# Patient Record
Sex: Male | Born: 1970 | Race: Black or African American | Hispanic: No | Smoking: Current some day smoker
Health system: Southern US, Community
[De-identification: ages and names within clinical notes are randomized; demographics above are authoritative.]

---

## 2003-06-03 ENCOUNTER — Encounter (INDEPENDENT_AMBULATORY_CARE_PROVIDER_SITE_OTHER): Payer: Self-pay | Admitting: Specialist

## 2003-06-03 ENCOUNTER — Ambulatory Visit (HOSPITAL_COMMUNITY): Admission: RE | Admit: 2003-06-03 | Discharge: 2003-06-03 | Payer: Self-pay | Admitting: Urology

## 2003-06-03 ENCOUNTER — Ambulatory Visit (HOSPITAL_BASED_OUTPATIENT_CLINIC_OR_DEPARTMENT_OTHER): Admission: RE | Admit: 2003-06-03 | Discharge: 2003-06-03 | Payer: Self-pay | Admitting: Urology

## 2008-10-12 ENCOUNTER — Encounter: Admission: RE | Admit: 2008-10-12 | Discharge: 2008-10-12 | Payer: Self-pay | Admitting: Family Medicine

## 2009-11-17 IMAGING — CR DG SHOULDER 2+V*R*
3 series · 3 of 3 positions shown · non-contrast
Comparison: None

CLINICAL DATA: Chronic pain, no trauma

RIGHT SHOULDER - 2+ VIEW

[view not recorded (1 of 3)]
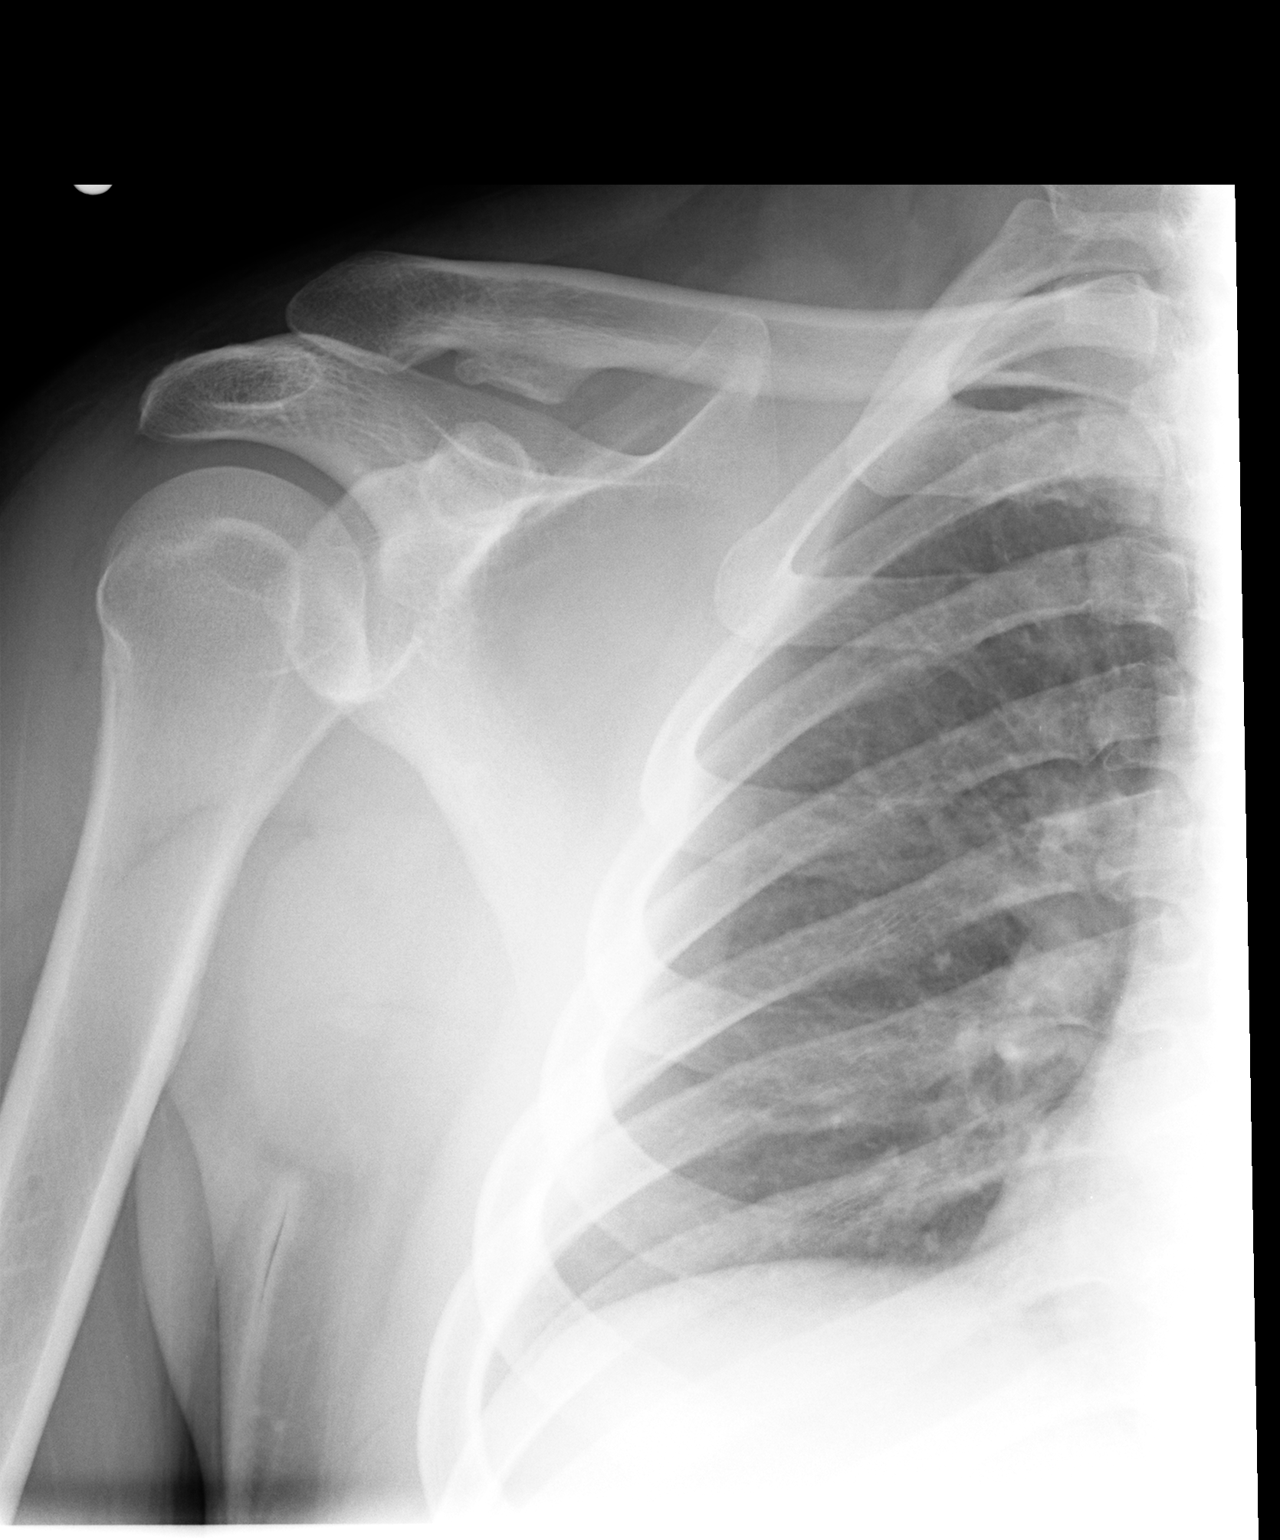

[view not recorded (2 of 3)]
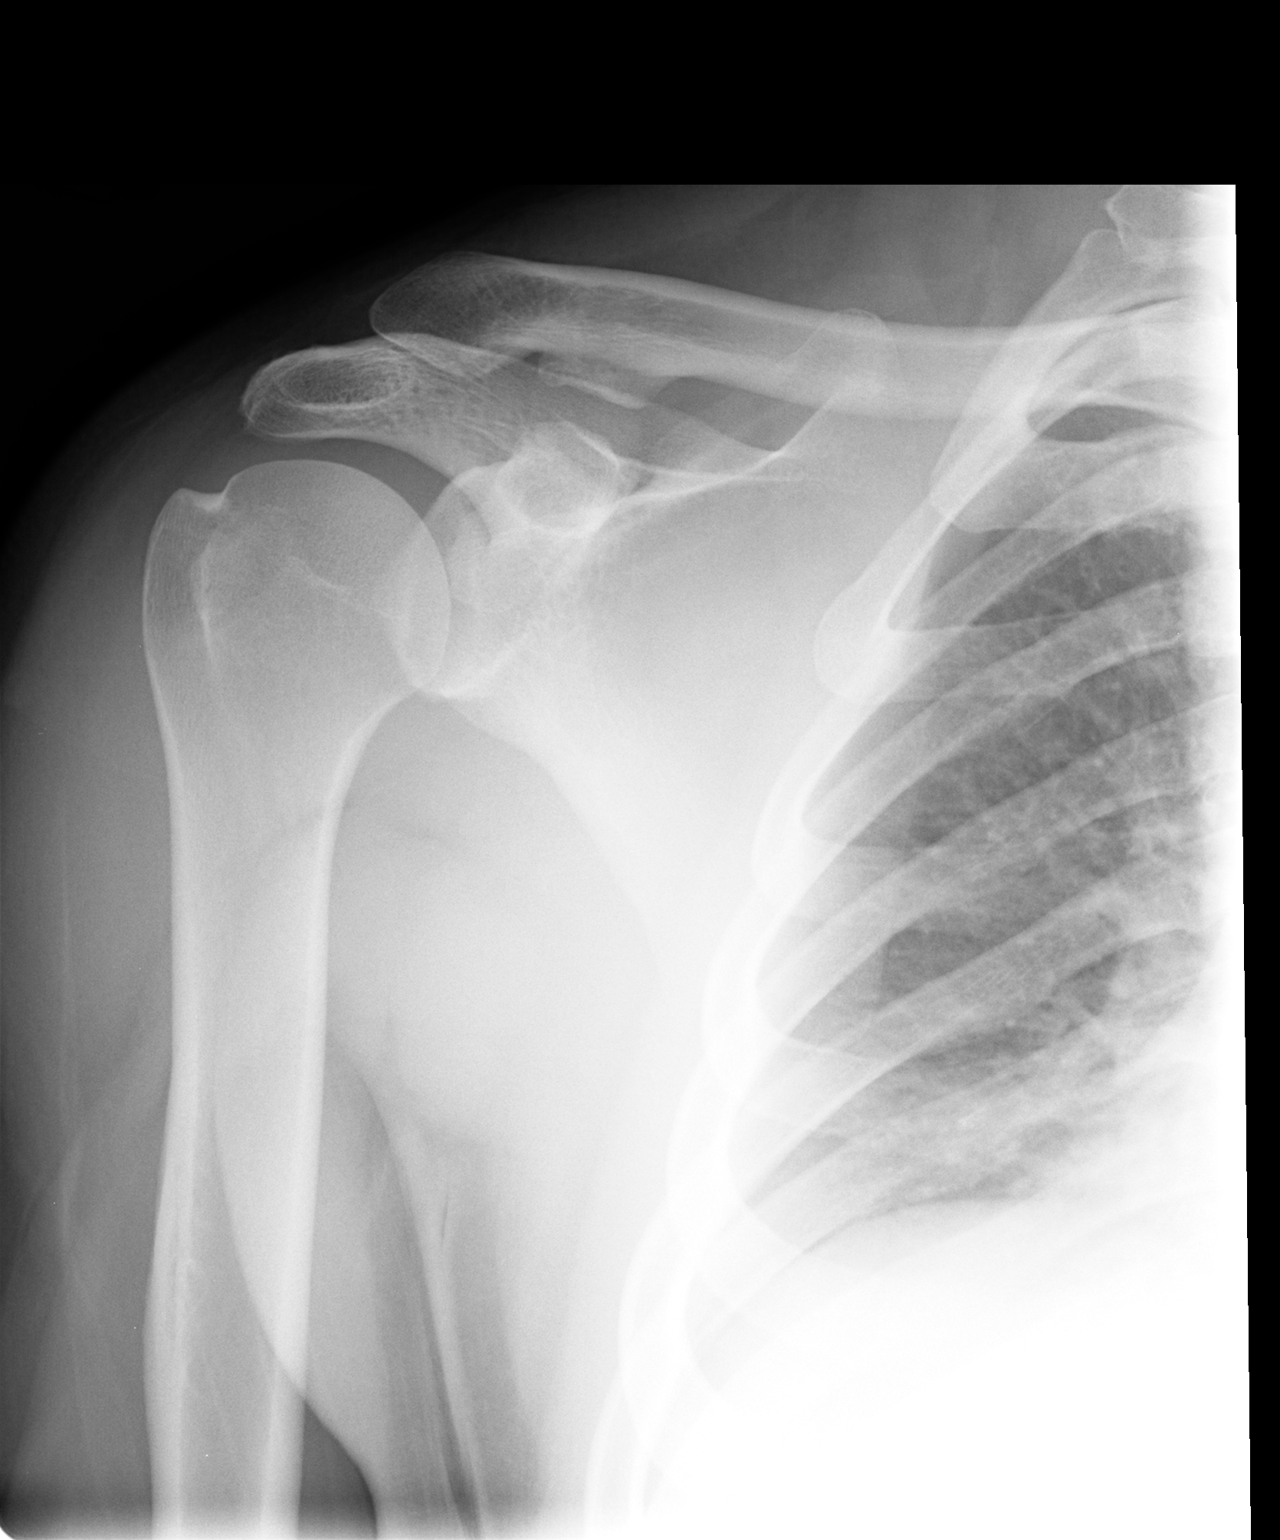

[view not recorded (3 of 3)]
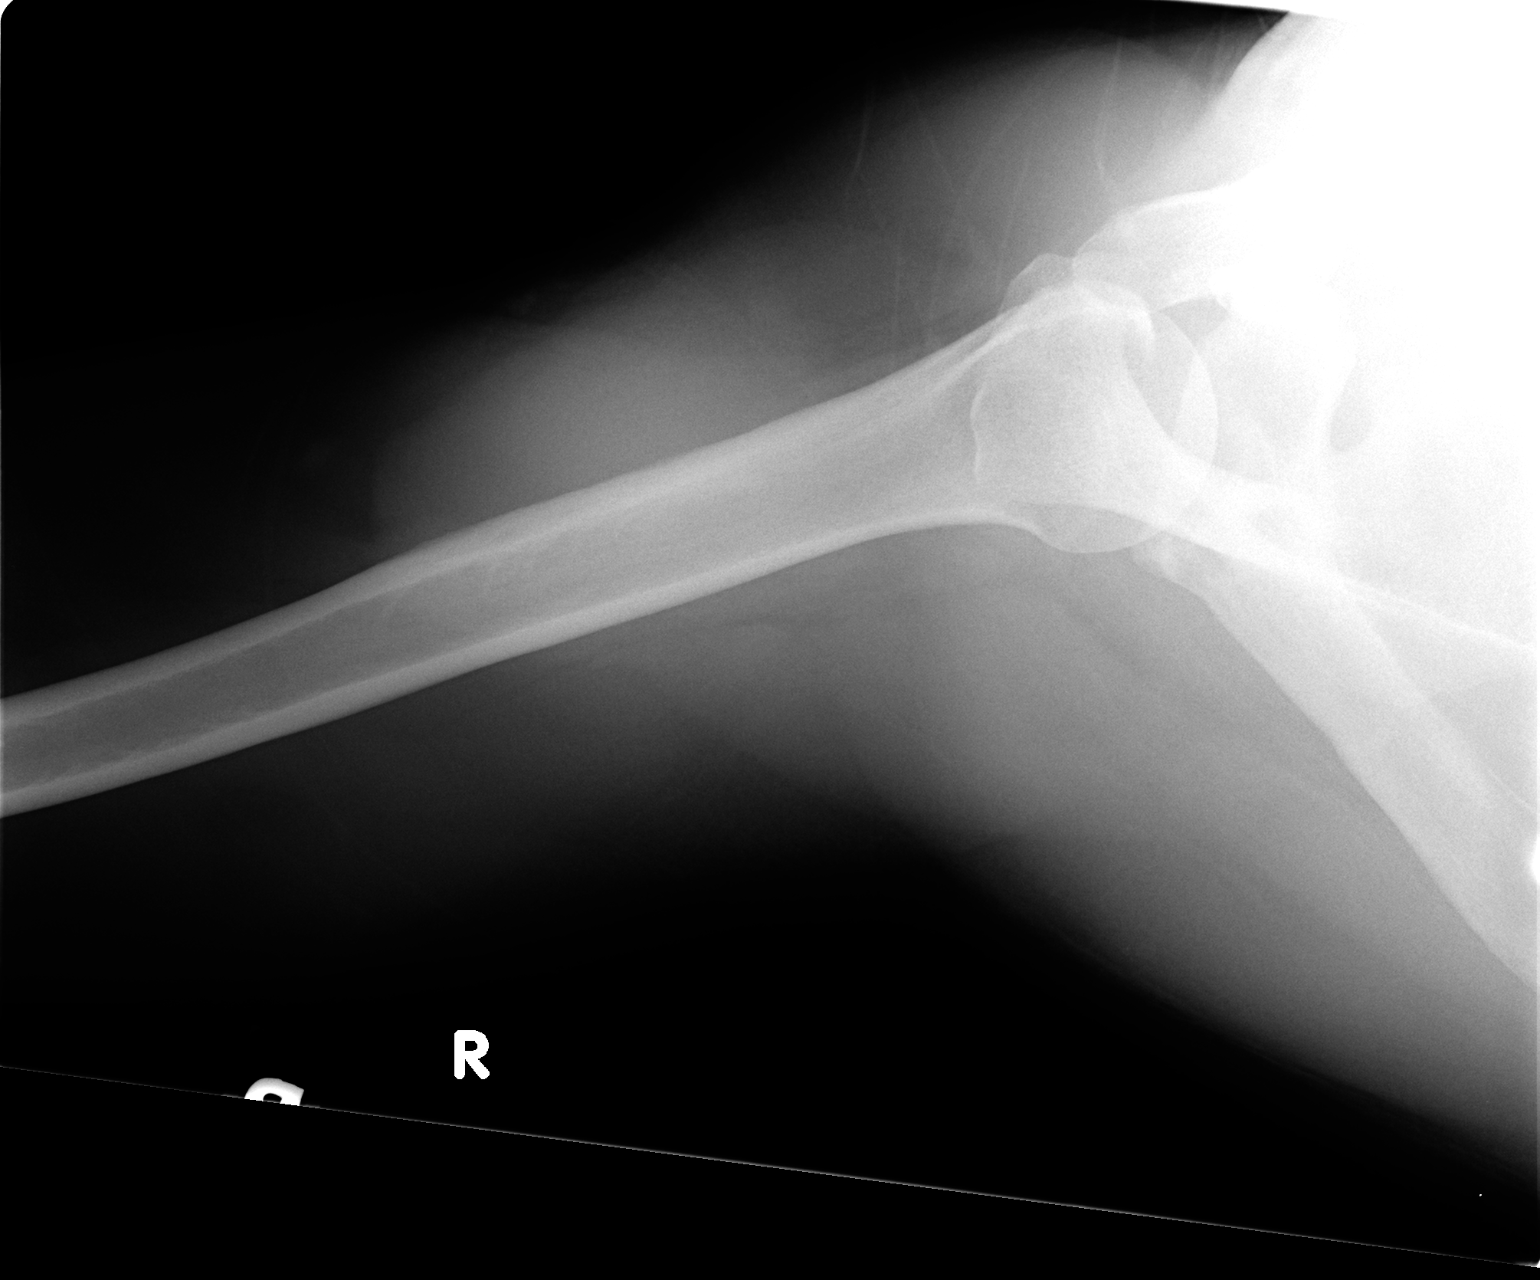

[3 of 3 positions shown; findings below may reference images not displayed]

FINDINGS: The right glenohumeral joint space appears normal.  The
right AC joint is normally aligned.  No acute abnormality is seen.
IMPRESSION: Negative right shoulder.

## 2010-10-13 NOTE — Op Note (Signed)
NAME:  Shane Mitchell, Shane Mitchell                         ACCOUNT NO.:  [id number]   MEDICAL RECORD NO.:  [id number]                   PATIENT TYPE:  AMB   LOCATION:  [facility]                                 FACILITY:  [facility]   PHYSICIAN:  Sigmund I. Patsi Sears, M.D.         DATE OF BIRTH:  12/23/1970   DATE OF PROCEDURE:  06/03/2003  DATE OF DISCHARGE:                                 OPERATIVE REPORT   PREOPERATIVE DIAGNOSIS:  Left varicocele.   POSTOPERATIVE DIAGNOSIS:  Left varicocele.   OPERATION:  Left internal spermatic vein ligation.   SURGEON:  Sigmund I. Patsi Sears, M.D.   ANESTHESIA:  LMA   PREPARATION:  After appropriate preanesthesia, the patient was brought to  the operating room, placed on the operating table in the dorsal supine  position where general LMA anesthesia was introduced. He remained in the  supine position where the pubis was washed was Betadine soap, shaven,  prepped with Betadine solution, and draped in the usual fashion.   DESCRIPTION OF PROCEDURE:  A 5 cm left inguinal incision was made,  subcutaneous tissue was dissected with the electrosurgical unit.  A large  superficial vein was identified and this was ligated with 3-0 silk suture.  Following this, the external oblique fascia was entered, distal to the  external inguinal ring, and the spermatic cord was identified by placing a  right angled clamp underneath the cord, in identifying the pubic tubercle.  A Penrose drain was then placed underneath the spermatic cord.   The spermatic cord was then dissected, with care taken to avoid injury to  the vas, or to the artery to the testicle. The artery was identified by  palpation and direct vision.   The remaining spermatic cord was dissected in multiple layers with  dissection of the veins and ligation of the veins with 3-0 silk sutures. The  venous structures were sent to the laboratory for evaluation.  0.25 plain  Marcaine plane was injected into the spermatic  cord to affect a spermatic  cord block, and also into the wound edges. Following this, the wound was  then closed in multiple layers with 3-0 Vicryl suture. The skin edges were  closed with skin stapler. The patient received IV Toradol and was awakened  and taken to the recovery room in good condition.                                               Sigmund I. Patsi Sears, M.D.    SIT/MEDQ  D:  06/03/2003  T:  06/03/2003  Job:  [id number]

## 2020-05-18 DIAGNOSIS — Z20822 Contact with and (suspected) exposure to covid-19: Secondary | ICD-10-CM | POA: Diagnosis not present

## 2020-06-22 DIAGNOSIS — Z20822 Contact with and (suspected) exposure to covid-19: Secondary | ICD-10-CM | POA: Diagnosis not present

## 2021-01-21 DIAGNOSIS — Z20822 Contact with and (suspected) exposure to covid-19: Secondary | ICD-10-CM | POA: Diagnosis not present

## 2021-03-13 ENCOUNTER — Ambulatory Visit: Payer: BC Managed Care – PPO | Admitting: Podiatry

## 2021-03-13 ENCOUNTER — Other Ambulatory Visit: Payer: Self-pay

## 2021-03-13 DIAGNOSIS — L600 Ingrowing nail: Secondary | ICD-10-CM | POA: Diagnosis not present

## 2021-03-13 MED ORDER — GENTAMICIN SULFATE 0.1 % EX CREA: 1.0000 "application " | TOPICAL_CREAM | Freq: Two times a day (BID) | CUTANEOUS | 1 refills | Status: AC

## 2021-03-13 MED ORDER — TERBINAFINE HCL 250 MG PO TABS: 250.0000 mg | ORAL_TABLET | Freq: Every day | ORAL | 0 refills | Status: AC

## 2021-03-13 NOTE — Progress Notes (Addendum)
   Subjective: Patient presents today for evaluation of pain to the lateral border right great toe. Patient is concerned for possible ingrown nail.  It is very sensitive to touch.  Patient also states that he has had some thick discoloration to his toenails for the last several years.  He would like to discuss different treatment options.  Patient presents today for further treatment and evaluation.  No past medical history on file.  Objective:  General: Well developed, nourished, in no acute distress, alert and oriented x3   Dermatology: Skin is warm, dry and supple bilateral.  Lateral border right great toe appears to be erythematous with evidence of an ingrowing nail. Pain on palpation noted to the border of the nail fold. The remaining nails appear unremarkable at this time. There are no open sores, lesions.  Hyperkeratotic dystrophic elongated nails also noted to the bilateral great toes with associated tenderness  Vascular: Dorsalis Pedis artery and Posterior Tibial artery pedal pulses palpable. No lower extremity edema noted.   Neruologic: Grossly intact via light touch bilateral.  Musculoskeletal: Muscular strength within normal limits in all groups bilateral. Normal range of motion noted to all pedal and ankle joints.   Assesement: #1 Paronychia with ingrowing nail lateral border right great toe #2 onychomycosis of toenails both  Plan of Care:  1. Patient evaluated.  2. Discussed treatment alternatives and plan of care. Explained nail avulsion procedure and post procedure course to patient. 3. Patient opted for permanent partial nail avulsion of the ingrown portion of the nail.  4. Prior to procedure, local anesthesia infiltration utilized using 3 ml of a 50:50 mixture of 2% plain lidocaine and 0.5% plain marcaine in a normal hallux block fashion and a betadine prep performed.  5. Partial permanent nail avulsion with chemical matrixectomy performed using 3x30sec applications of  phenol followed by alcohol flush.  6. Light dressing applied.  Post care instructions provided 7.  Prescription for gentamicin 2% cream  8.  Today we discussed different treatment options for onychomycosis of the toenails including oral, topical, and laser antifungal treatment modalities.  Efficacies and side effects explained.  The patient opts for oral antifungal treatment.  He is otherwise healthy and denies a history of liver pathology or symptoms.   9.  Prescription for Lamisil 2 and 50 mg #90 daily  10.  Return to clinic 2 weeks.  *Financing department at [facility]  Felecia Shelling, DPM [facility]  Dr. Felecia Shelling, DPM    [address] [place], [place] [address]                Office [phone]  Fax [phone]

## 2021-03-13 NOTE — Addendum Note (Signed)
Addended by: Felecia Shelling on: 03/13/2021 12:05 PM   Modules accepted: Orders

## 2021-03-27 ENCOUNTER — Ambulatory Visit: Payer: BC Managed Care – PPO | Admitting: Podiatry

## 2021-09-29 ENCOUNTER — Telehealth: Payer: BC Managed Care – PPO | Admitting: Physician Assistant

## 2021-09-29 DIAGNOSIS — L301 Dyshidrosis [pompholyx]: Secondary | ICD-10-CM

## 2021-09-29 MED ORDER — TRIAMCINOLONE ACETONIDE 0.1 % EX CREA: 1.0000 "application " | TOPICAL_CREAM | Freq: Two times a day (BID) | CUTANEOUS | 0 refills | Status: AC

## 2021-09-29 NOTE — Progress Notes (Signed)
E-Visit for Eczema ? ?We are sorry that you are not feeling well. Here is how we plan to help! ?Based on what you shared with me it looks like you have eczema (atopic dermatitis).  Although the cause of eczema is not completely understood, genetics appear to play a strong role, and people with a family history of eczema are at increased risk of developing the condition. In most people with eczema, there is a genetic abnormality in the outermost layer of the skin, called the epidermis  ? ?Most people with eczema develop their first symptoms as children, before the age of five. Intense itching of the skin, patches of redness, small bumps, and skin flaking are common. Scratching can further inflame the skin and worsen the itching. The itchiness may be more noticeable at nighttime. ? ?Eczema commonly affects the back of the neck, the elbow creases, and the backs of the knees. Other affected areas may include the face, wrists, and forearms. The skin may become thickened and darkened, or even scarred, from repeated scratching. ?Eliminating factors that aggravate your eczema symptoms can help to control the symptoms. Possible triggers may include: ?? Cold or dry environments ?? Sweating ?? Emotional stress or anxiety ?? Rapid temperature changes ?? Exposure to certain chemicals or cleaning solutions, including soaps and detergents, perfumes and cosmetics, wool or synthetic fibers, dust, sand, and cigarette smoke ?Keeping your skin hydrated ?Emollients -- Emollients are creams and ointments that moisturize the skin and prevent it from drying out. The best emollients for people with eczema are thick creams (such as Eucerin, Cetaphil, and Nutraderm) or ointments (such as petroleum jelly, Aquaphor, and Vaseline), which contain little to no water. Emollients are most effective when applied immediately after bathing. Emollients can be applied twice a day or more often if needed. Lotions contain more water than creams and  ointments and are less effective for moisturizing the skin. ?Bathing -- It is not clear if showers or baths are better for keeping the skin hydrated. Lukewarm baths or showers can hydrate and cool the skin, temporarily relieving itching from eczema. An unscented, mild soap or non-soap cleanser (such as Cetaphil) should be used sparingly. Apply an emollient immediately after bathing or showering to prevent your skin from drying out as a result of water evaporation. Emollient bath additives (products you add to the bath water) have not been found to help relieve symptoms. ?Hot or long baths (more than 10 to 15 minutes) and showers should be avoided since they can dry out the skin. ? ?Based on what you shared with me you may have eczema.  ? ?I have prescribed: and Triamcinalone ointment (or cream). Apply to the effected areas twice per day. ? ?I recommend dilute bleach baths for people with eczema. These baths help to decrease the number of bacteria on the skin that can cause infections or worsen symptoms. To prepare a bleach bath, one-fourth to one-half cup of bleach is placed in a full bathtub (about 40 gallons) of water. Bleach baths are usually taken for 5 to 10 minutes twice per week and should be followed by application of an emollient (listed above). ?I recommend you take Benadryl 25mg - 50mg every 4 hours to control the symptoms (including itching) but if they last over 24 hours it is best that you see an office based provider for follow up. ? ?HOME CARE: ?Take lukewarm showers or baths ?Apply creams and ointments to prevent the skin from drying (Eucerin, Cetaphil, Nutraderm, petroleum jelly, Aquaphor or   Vaseline) - these products contain less water than other lotions and are more effective for moisturizing the skin ?Limit exposure to cold or dry environments, sweating, emotional stress and anxiety, rapid temperature changes and exposure to chemicals and cleaning products, soaps and detergents, perfumes,  cosmetics, wool and synthetic fibers, dust, sand and cigarette- factors which can aggravate eczema symptoms.  ?Use a hydrocortisone cream once or twice a day ?Take an antihistamine like Benadryl for widespread rashes that itch.  The adult dosage of Benadryl is 25-50 mg by mouth 4 times daily. ?Caution: This type of medication may cause sleepiness.  Do not drink alcohol, drive, or operate dangerous machinery while taking antihistamines.  Do not take these medications if you have prostate enlargement.  Read the package instructions thoroughly on all medications that you take.  ?GET HELP RIGHT AWAY IF: ?Symptoms that don't go away after treatment. ?Severe itching that persists. ?You develop a fever. ?Your skin begins to drain. ?You have a sore throat. ?You become short of breath. ? ?MAKE SURE YOU  ? ?Understand these instructions. ?Will watch your condition. ?Will get help right away if you are not doing well or get worse. ? ? ?Thank you for choosing an e-visit. ? ?Your e-visit answers were reviewed by a board certified advanced clinical practitioner to complete your personal care plan. Depending upon the condition, your plan could have included both over the counter or prescription medications. ? ?Please review your pharmacy choice. Make sure the pharmacy is open so you can pick up prescription now. If there is a problem, you may contact your provider through MyChart messaging and have the prescription routed to another pharmacy.  Your safety is important to us. If you have drug allergies check your prescription carefully.  ? ?For the next 24 hours you can use MyChart to ask questions about today's visit, request a non-urgent call back, or ask for a work or school excuse. ?You will get an email in the next two days asking about your experience. I hope that your e-visit has been valuable and will speed your recovery. ? ?I provided 5 minutes of non face-to-face time during this encounter for chart review and  documentation.  ? ?

## 2021-10-04 DIAGNOSIS — M20011 Mallet finger of right finger(s): Secondary | ICD-10-CM | POA: Diagnosis not present

## 2022-01-23 ENCOUNTER — Ambulatory Visit: Payer: BC Managed Care – PPO | Admitting: Nurse Practitioner

## 2022-02-14 ENCOUNTER — Telehealth: Payer: BC Managed Care – PPO | Admitting: Physician Assistant

## 2022-02-14 DIAGNOSIS — J069 Acute upper respiratory infection, unspecified: Secondary | ICD-10-CM

## 2022-02-14 MED ORDER — BENZONATATE 100 MG PO CAPS: 100.0000 mg | ORAL_CAPSULE | Freq: Three times a day (TID) | ORAL | 0 refills | Status: AC | PRN

## 2022-02-14 MED ORDER — IPRATROPIUM BROMIDE 0.03 % NA SOLN: 2.0000 | Freq: Two times a day (BID) | NASAL | 0 refills | Status: AC

## 2022-02-14 MED ORDER — PSEUDOEPH-BROMPHEN-DM 30-2-10 MG/5ML PO SYRP: 5.0000 mL | ORAL_SOLUTION | Freq: Four times a day (QID) | ORAL | 0 refills | Status: AC | PRN

## 2022-02-14 NOTE — Patient Instructions (Signed)
Shane Mitchell, thank you for joining Mar Daring, PA-C for today's virtual visit.  While this provider is not your primary care provider (PCP), if your PCP is located in our provider database this encounter information will be shared with them immediately following your visit.  Consent: (Patient) Shane Mitchell provided verbal consent for this virtual visit at the beginning of the encounter.  Current Medications:  Current Outpatient Medications:    benzonatate (TESSALON) 100 MG capsule, Take 1 capsule (100 mg total) by mouth 3 (three) times daily as needed., Disp: 30 capsule, Rfl: 0   brompheniramine-pseudoephedrine-DM 30-2-10 MG/5ML syrup, Take 5 mLs by mouth 4 (four) times daily as needed., Disp: 120 mL, Rfl: 0   ipratropium (ATROVENT) 0.03 % nasal spray, Place 2 sprays into both nostrils every 12 (twelve) hours., Disp: 30 mL, Rfl: 0   gentamicin cream (GARAMYCIN) 0.1 %, Apply 1 application topically 2 (two) times daily., Disp: 30 g, Rfl: 1   terbinafine (LAMISIL) 250 MG tablet, Take 1 tablet (250 mg total) by mouth daily., Disp: 90 tablet, Rfl: 0   triamcinolone cream (KENALOG) 0.1 %, Apply 1 application. topically 2 (two) times daily., Disp: 80 g, Rfl: 0   Medications ordered in this encounter:  Meds ordered this encounter  Medications   brompheniramine-pseudoephedrine-DM 30-2-10 MG/5ML syrup    Sig: Take 5 mLs by mouth 4 (four) times daily as needed.    Dispense:  120 mL    Refill:  0    Order Specific Question:   Supervising Provider    Answer:   Chase Picket [[id number]]   benzonatate (TESSALON) 100 MG capsule    Sig: Take 1 capsule (100 mg total) by mouth 3 (three) times daily as needed.    Dispense:  30 capsule    Refill:  0    Order Specific Question:   Supervising Provider    Answer:   Chase Picket [[id number]]   ipratropium (ATROVENT) 0.03 % nasal spray    Sig: Place 2 sprays into both nostrils every 12 (twelve) hours.    Dispense:  30 mL    Refill:  0     Order Specific Question:   Supervising Provider    Answer:   Chase Picket [[id number]]     *If you need refills on other medications prior to your next appointment, please contact your pharmacy*  Follow-Up: Call back or seek an in-person evaluation if the symptoms worsen or if the condition fails to improve as anticipated.  Other Instructions  Upper Respiratory Infection, Adult An upper respiratory infection (URI) is a common viral infection of the nose, throat, and upper air passages that lead to the lungs. The most common type of URI is the common cold. URIs usually get better on their own, without medical treatment. What are the causes? A URI is caused by a virus. You may catch a virus by: Breathing in droplets from an infected person's cough or sneeze. Touching something that has been exposed to the virus (is contaminated) and then touching your mouth, nose, or eyes. What increases the risk? You are more likely to get a URI if: You are very young or very old. You have close contact with others, such as at work, school, or a health care facility. You smoke. You have long-term (chronic) heart or lung disease. You have a weakened disease-fighting system (immune system). You have nasal allergies or asthma. You are experiencing a lot of stress. You have poor nutrition. What are  the signs or symptoms? A URI usually involves some of the following symptoms: Runny or stuffy (congested) nose. Cough. Sneezing. Sore throat. Headache. Fatigue. Fever. Loss of appetite. Pain in your forehead, behind your eyes, and over your cheekbones (sinus pain). Muscle aches. Redness or irritation of the eyes. Pressure in the ears or face. How is this diagnosed? This condition may be diagnosed based on your medical history and symptoms, and a physical exam. Your health care provider may use a swab to take a mucus sample from your nose (nasal swab). This sample can be tested to determine what  virus is causing the illness. How is this treated? URIs usually get better on their own within 7-10 days. Medicines cannot cure URIs, but your health care provider may recommend certain medicines to help relieve symptoms, such as: Over-the-counter cold medicines. Cough suppressants. Coughing is a type of defense against infection that helps to clear the respiratory system, so take these medicines only as recommended by your health care provider. Fever-reducing medicines. Follow these instructions at home: Activity Rest as needed. If you have a fever, stay home from work or school until your fever is gone or until your health care provider says your URI cannot spread to other people (is no longer contagious). Your health care provider may have you wear a face mask to prevent your infection from spreading. Relieving symptoms Gargle with a mixture of salt and water 3-4 times a day or as needed. To make salt water, completely dissolve -1 tsp (3-6 g) of salt in 1 cup (237 mL) of warm water. Use a cool-mist humidifier to add moisture to the air. This can help you breathe more easily. Eating and drinking  Drink enough fluid to keep your urine pale yellow. Eat soups and other clear broths. General instructions  Take over-the-counter and prescription medicines only as told by your health care provider. These include cold medicines, fever reducers, and cough suppressants. Do not use any products that contain nicotine or tobacco. These products include cigarettes, chewing tobacco, and vaping devices, such as e-cigarettes. If you need help quitting, ask your health care provider. Stay away from secondhand smoke. Stay up to date on all immunizations, including the yearly (annual) flu vaccine. Keep all follow-up visits. This is important. How to prevent the spread of infection to others URIs can be contagious. To prevent the infection from spreading: Wash your hands with soap and water for at least 20  seconds. If soap and water are not available, use hand sanitizer. Avoid touching your mouth, face, eyes, or nose. Cough or sneeze into a tissue or your sleeve or elbow instead of into your hand or into the air.  Contact a health care provider if: You are getting worse instead of better. You have a fever or chills. Your mucus is brown or red. You have yellow or brown discharge coming from your nose. You have pain in your face, especially when you bend forward. You have swollen neck glands. You have pain while swallowing. You have white areas in the back of your throat. Get help right away if: You have shortness of breath that gets worse. You have severe or persistent: Headache. Ear pain. Sinus pain. Chest pain. You have chronic lung disease along with any of the following: Making high-pitched whistling sounds when you breathe, most often when you breathe out (wheezing). Prolonged cough (more than 14 days). Coughing up blood. A change in your usual mucus. You have a stiff neck. You have changes in  your: Vision. Hearing. Thinking. Mood. These symptoms may be an emergency. Get help right away. Call 911. Do not wait to see if the symptoms will go away. Do not drive yourself to the hospital. Summary An upper respiratory infection (URI) is a common infection of the nose, throat, and upper air passages that lead to the lungs. A URI is caused by a virus. URIs usually get better on their own within 7-10 days. Medicines cannot cure URIs, but your health care provider may recommend certain medicines to help relieve symptoms. This information is not intended to replace advice given to you by your health care provider. Make sure you discuss any questions you have with your health care provider. Document Revised: 12/14/2020 Document Reviewed: 12/14/2020 Elsevier Patient Education  2023 [employer]    If you have been instructed to have an in-person evaluation today at a local Urgent  Care facility, please use the link below. It will take you to a list of all of our available [facility], including address, phone number and hours of operation. Please do not delay care.  [facility]  If you or a family member do not have a primary care provider, use the link below to schedule a visit and establish care. When you choose a [facility] primary care physician or advanced practice provider, you gain a long-term partner in health. Find a Primary Care Provider  Learn more about [facility]'s in-office and virtual care options: [facility] - Get Care Now

## 2022-02-14 NOTE — Progress Notes (Signed)
Virtual Visit Consent   Shane Mitchell, you are scheduled for a virtual visit with a [facility] provider today. Just as with appointments in the office, your consent must be obtained to participate. Your consent will be active for this visit and any virtual visit you may have with one of our providers in the next 365 days. If you have a MyChart account, a copy of this consent can be sent to you electronically.  As this is a virtual visit, video technology does not allow for your provider to perform a traditional examination. This may limit your provider's ability to fully assess your condition. If your provider identifies any concerns that need to be evaluated in person or the need to arrange testing (such as labs, EKG, etc.), we will make arrangements to do so. Although advances in technology are sophisticated, we cannot ensure that it will always work on either your end or our end. If the connection with a video visit is poor, the visit may have to be switched to a telephone visit. With either a video or telephone visit, we are not always able to ensure that we have a secure connection.  By engaging in this virtual visit, you consent to the provision of healthcare and authorize for your insurance to be billed (if applicable) for the services provided during this visit. Depending on your insurance coverage, you may receive a charge related to this service.  I need to obtain your verbal consent now. Are you willing to proceed with your visit today? Shane Mitchell has provided verbal consent on 02/14/2022 for a virtual visit (video or telephone). Margaretann Loveless, PA-C  Date: 02/14/2022 9:26 AM  Virtual Visit via Video Note   I, Margaretann Loveless, connected with  Shane Mitchell  ([id number], [birth year]) on 02/14/22 at  9:15 AM EDT by a video-enabled telemedicine application and verified that I am speaking with the correct person using two identifiers.  Location: Patient: Virtual Visit Location  Patient: Home Provider: Virtual Visit Location Provider: Home Office   I discussed the limitations of evaluation and management by telemedicine and the availability of in person appointments. The patient expressed understanding and agreed to proceed.    History of Present Illness: Shane Mitchell is a 51 y.o. who identifies as a male who was assigned male at birth, and is being seen today for URI symptoms.  HPI: URI  This is a new problem. The current episode started in the past 7 days. There has been no fever. Associated symptoms include congestion, coughing, headaches, rhinorrhea, sinus pain (pressure), a sore throat and swollen glands. Pertinent negatives include no diarrhea, ear pain, nausea, plugged ear sensation or vomiting. Associated symptoms comments: Fatigue, sweats, body aches. He has tried increased fluids and sleep (theraflu, tylenol cold and flu) for the symptoms. The treatment provided no relief.  At home covid testing was negative.    Problems: There are no problems to display for this patient.   Allergies: No Known Allergies Medications:  Current Outpatient Medications:    benzonatate (TESSALON) 100 MG capsule, Take 1 capsule (100 mg total) by mouth 3 (three) times daily as needed., Disp: 30 capsule, Rfl: 0   brompheniramine-pseudoephedrine-DM 30-2-10 MG/5ML syrup, Take 5 mLs by mouth 4 (four) times daily as needed., Disp: 120 mL, Rfl: 0   ipratropium (ATROVENT) 0.03 % nasal spray, Place 2 sprays into both nostrils every 12 (twelve) hours., Disp: 30 mL, Rfl: 0   gentamicin cream (GARAMYCIN) 0.1 %, Apply  1 application topically 2 (two) times daily., Disp: 30 g, Rfl: 1   terbinafine (LAMISIL) 250 MG tablet, Take 1 tablet (250 mg total) by mouth daily., Disp: 90 tablet, Rfl: 0   triamcinolone cream (KENALOG) 0.1 %, Apply 1 application. topically 2 (two) times daily., Disp: 80 g, Rfl: 0  Observations/Objective: Patient is well-developed, well-nourished in no acute distress.   Resting comfortably at home.  Head is normocephalic, atraumatic.  No labored breathing.  Speech is clear and coherent with logical content.  Patient is alert and oriented at baseline.    Assessment and Plan: 1. Viral URI with cough - brompheniramine-pseudoephedrine-DM 30-2-10 MG/5ML syrup; Take 5 mLs by mouth 4 (four) times daily as needed.  Dispense: 120 mL; Refill: 0 - benzonatate (TESSALON) 100 MG capsule; Take 1 capsule (100 mg total) by mouth 3 (three) times daily as needed.  Dispense: 30 capsule; Refill: 0 - ipratropium (ATROVENT) 0.03 % nasal spray; Place 2 sprays into both nostrils every 12 (twelve) hours.  Dispense: 30 mL; Refill: 0  - Suspect viral URI - Symptomatic medications of choice over the counter as needed - Bromfed DM, Tessalon perles and Ipratropium prescribed - Push fluids - Rest - Seek further evaluation if symptoms change or worsen   Follow Up Instructions: I discussed the assessment and treatment plan with the patient. The patient was provided an opportunity to ask questions and all were answered. The patient agreed with the plan and demonstrated an understanding of the instructions.  A copy of instructions were sent to the patient via MyChart unless otherwise noted below.    The patient was advised to call back or seek an in-person evaluation if the symptoms worsen or if the condition fails to improve as anticipated.  Time:  I spent 11 minutes with the patient via telehealth technology discussing the above problems/concerns.    Mar Daring, PA-C

## 2022-11-05 DIAGNOSIS — M25519 Pain in unspecified shoulder: Secondary | ICD-10-CM | POA: Diagnosis not present

## 2023-10-17 ENCOUNTER — Encounter: Payer: Self-pay | Admitting: Emergency Medicine

## 2023-10-17 ENCOUNTER — Ambulatory Visit
Admission: EM | Admit: 2023-10-17 | Discharge: 2023-10-17 | Disposition: A | Discharge status: Home / Self Care | Attending: Nurse Practitioner | Admitting: Nurse Practitioner

## 2023-10-17 DIAGNOSIS — M25512 Pain in left shoulder: Secondary | ICD-10-CM | POA: Diagnosis not present

## 2023-10-17 MED ORDER — NAPROXEN 500 MG PO TABS: 500.0000 mg | ORAL_TABLET | Freq: Two times a day (BID) | ORAL | 0 refills | Status: AC

## 2023-10-17 NOTE — ED Triage Notes (Addendum)
 Pt reports L shoulder pain that started last night. No injury or trauma to area. Pt said he was just watching television prior to noticing aching pain. Pt can freely move shoulder with increased pain, but cannot move against resistance. No previous hx of similar issues. Pt reports taking 2 81mg  aspirin ( ago). Pt denies chest pain or palpitations. Notes pain has only moved across upper shoulder.

## 2023-10-17 NOTE — Discharge Instructions (Addendum)
 You were seen today for left shoulder pain. Based on your exam and symptoms, the pain appears to be musculoskeletal in nature. There are no signs to suggest a cardiac cause at this time. You were prescribed naproxen to take twice a day with food. Do not take any over-the-counter NSAIDs like ibuprofen or Aleve while using this medication. You may continue to take Tylenol (acetaminophen) as needed for additional pain relief. For comfort, alternate between applying ice and heat to the shoulder. Ice can help reduce inflammation, while heat can help relax the muscles. Monitor your symptoms, and if the pain worsens or new symptoms develop, follow up with your healthcare provider and/or orthopedics.

## 2023-10-17 NOTE — ED Provider Notes (Signed)
 [facility]    CSN: [id number] Arrival date & time: 10/17/23  1923      History   Chief Complaint Chief Complaint  Patient presents with   Shoulder Pain    HPI Shane Mitchell is a 53 y.o. male.    Shane Mitchell is a 53 year old male presenting with left shoulder pain that began last night. Shane Mitchell describes the pain as dull and achy, localized to the left shoulder, and worsened with movement. Keeping the shoulder still offers some relief. Shane Mitchell took Tylenol and used a heating pad, both of which provided partial symptom improvement. Shane Mitchell reports a mild tingling sensation in the fingers of the affected arm but denies numbness, weakness, or sensory loss.  Shane Mitchell has no prior history of shoulder issues and denies any recent trauma, heavy lifting, or strenuous activity that could have precipitated the pain. The patient notes occasional palpitations, which Shane Mitchell states are not new, but denies chest pain, shortness of breath, or other cardiac symptoms. His sister raised concern due to the recent death of a family friend who was reportedly found with an ice pack on his shoulder, prompting consideration of a potential cardiac cause. However, the patient himself does not report any associated symptoms to suggest acute cardiac involvement at this time.  The following portions of the patient's history were reviewed and updated as appropriate: allergies, current medications, past family history, past medical history, past social history, past surgical history, and problem list.         History reviewed. No pertinent past medical history.  There are no active problems to display for this patient.   History reviewed. No pertinent surgical history.     Home Medications    Prior to Admission medications   Medication Sig Start Date End Date Taking? Authorizing Provider  naproxen (NAPROSYN) 500 MG tablet Take 1 tablet (500 mg total) by mouth 2 (two) times daily with a meal. 10/17/23  Yes  Maryruth Sol, FNP  benzonatate  (TESSALON ) 100 MG capsule Take 1 capsule (100 mg total) by mouth 3 (three) times daily as needed. Patient not taking: Reported on 10/17/2023 02/14/22   Angelia Kelp, PA-C  brompheniramine-pseudoephedrine-DM 30-2-10 MG/5ML syrup Take 5 mLs by mouth 4 (four) times daily as needed. Patient not taking: Reported on 10/17/2023 02/14/22   Angelia Kelp, PA-C  gentamicin  cream (GARAMYCIN ) 0.1 % Apply 1 application topically 2 (two) times daily. Patient not taking: Reported on 10/17/2023 03/13/21   Dot Gazella, DPM  ipratropium (ATROVENT ) 0.03 % nasal spray Place 2 sprays into both nostrils every 12 (twelve) hours. Patient not taking: Reported on 10/17/2023 02/14/22   Angelia Kelp, PA-C  terbinafine  (LAMISIL ) 250 MG tablet Take 1 tablet (250 mg total) by mouth daily. Patient not taking: Reported on 10/17/2023 03/13/21   Dot Gazella, DPM  triamcinolone  cream (KENALOG ) 0.1 % Apply 1 application. topically 2 (two) times daily. 09/29/21  Yes Angelia Kelp, PA-C    Family History Family History  Problem Relation Age of Onset   Prostate cancer Father     Social History Social History   Tobacco Use   Smoking status: Some Days    Types: Cigars   Smokeless tobacco: Never  Vaping Use   Vaping status: Never Used  Substance Use Topics   Alcohol use: Not Currently   Drug use: Never     Allergies   Patient has no known allergies.   Review of Systems Review of Systems  Respiratory:  Negative for shortness of breath.   Cardiovascular:  Negative for chest pain and palpitations.  Musculoskeletal:  Positive for arthralgias. Negative for neck pain.  Neurological:  Negative for weakness and numbness.       A little tingling in the left hand  All other systems reviewed and are negative.    Physical Exam Triage Vital Signs ED Triage Vitals  Encounter Vitals Group     BP 10/17/23 1934 (!) 142/92     Systolic BP Percentile --       Diastolic BP Percentile --      Pulse Rate 10/17/23 1934 87     Resp 10/17/23 1934 16     Temp 10/17/23 1934 98.3 F (36.8 C)     Temp Source 10/17/23 1934 Oral     SpO2 10/17/23 1934 95 %     Weight --      Height --      Head Circumference --      Peak Flow --      Pain Score 10/17/23 1935 3     Pain Loc --      Pain Education --      Exclude from Growth Chart --    No data found.  Updated Vital Signs BP (!) 134/95 (BP Location: Left Arm)   Pulse 87   Temp 98.3 F (36.8 C) (Oral)   Resp 16   SpO2 95%   Visual Acuity Right Eye Distance:   Left Eye Distance:   Bilateral Distance:    Right Eye Near:   Left Eye Near:    Bilateral Near:     Physical Exam Vitals reviewed.  Constitutional:      General: Shane Mitchell is not in acute distress.    Appearance: Normal appearance. Shane Mitchell is not toxic-appearing.  HENT:     Head: Normocephalic.     Mouth/Throat:     Mouth: Mucous membranes are moist.  Eyes:     Conjunctiva/sclera: Conjunctivae normal.  Cardiovascular:     Rate and Rhythm: Normal rate and regular rhythm.     Pulses: Normal pulses.     Heart sounds: Normal heart sounds, S1 normal and S2 normal.  Pulmonary:     Effort: Pulmonary effort is normal.     Breath sounds: Normal breath sounds and air entry.  Musculoskeletal:     Left shoulder: Tenderness present. No swelling, deformity, effusion, bony tenderness or crepitus. Decreased range of motion. Normal strength.     Right lower leg: No edema.     Left lower leg: No edema.     Comments: Limited shoulder range of motion secondary to pain. There is focal tenderness over the greater and lesser tubercle regions. No pain is elicited over the [facility] joint, and no bony deformities observed.   Skin:    General: Skin is warm and dry.  Neurological:     General: No focal deficit present.     Mental Status: Shane Mitchell is alert and oriented to person, place, and time.     Cranial Nerves: Cranial nerves 2-12 are intact.     Sensory: Sensation  is intact. No sensory deficit.     Motor: Motor function is intact.     Coordination: Coordination is intact.     Gait: Gait is intact.      UC Treatments / Results  Labs (all labs ordered are listed, but only abnormal results are displayed) Labs Reviewed - No data to display  EKG   Radiology No results found.  Procedures Procedures (including critical care time)  Medications Ordered in UC Medications - No data to display  Initial Impression / Assessment and Plan / UC Course  I have reviewed the triage vital signs and the nursing notes.  Pertinent labs & imaging results that were available during my care of the patient were reviewed by me and considered in my medical decision making (see chart for details).     53 year old male presents with acute onset of left shoulder pain described as dull and achy, with associated tingling in the fingers. The pain is exacerbated by movement and alleviated with rest. On physical exam, the pain is reproducible with palpation, particularly over the greater and lesser tubercle regions, and range of motion is limited due to discomfort. These findings are most consistent with a musculoskeletal etiology. Although cardiac causes were considered-particularly due to family concern and the patient's report of occasional palpitations-the absence of chest pain, shortness of breath, and the reproducible, localized nature of the pain make cardiac involvement unlikely. The differential includes rotator cuff strain, tendinopathy, or localized inflammation possibly due to minor, unrecognized overuse or trauma. Patient was started on naproxen  500 mg twice daily with meals and instructed to avoid other over-the-counter NSAIDs while on this medication. Shane Mitchell may continue Tylenol for breakthrough pain. Advised to alternate ice and heat to the affected area. Patient instructed to monitor symptoms closely and follow up with orthopedics if there is no  improvement.  Today's evaluation has revealed no signs of a dangerous process. Discussed diagnosis with patient and/or guardian. Patient and/or guardian aware of their diagnosis, possible red flag symptoms to watch out for and need for close follow up. Patient and/or guardian understands verbal and written discharge instructions. Patient and/or guardian comfortable with plan and disposition.  Patient and/or guardian has a clear mental status at this time, good insight into illness (after discussion and teaching) and has clear judgment to make decisions regarding their care  Documentation was completed with the aid of voice recognition software. Transcription may contain typographical errors. Final Clinical Impressions(s) / UC Diagnoses   Final diagnoses:  Acute pain of left shoulder     Discharge Instructions      You were seen today for left shoulder pain. Based on your exam and symptoms, the pain appears to be musculoskeletal in nature. There are no signs to suggest a cardiac cause at this time. You were prescribed naproxen  to take twice a day with food. Do not take any over-the-counter NSAIDs like ibuprofen or Aleve  while using this medication. You may continue to take Tylenol (acetaminophen) as needed for additional pain relief. For comfort, alternate between applying ice and heat to the shoulder. Ice can help reduce inflammation, while heat can help relax the muscles. Monitor your symptoms, and if the pain worsens or new symptoms develop, follow up with your healthcare provider and/or orthopedics.      ED Prescriptions     Medication Sig Dispense Auth. Provider   naproxen  (NAPROSYN ) 500 MG tablet Take 1 tablet (500 mg total) by mouth 2 (two) times daily with a meal. 20 tablet Maryruth Sol, FNP      PDMP not reviewed this encounter.   Maryruth Sol, [place] 10/18/23 [phone]

## 2024-05-30 ENCOUNTER — Encounter (HOSPITAL_COMMUNITY): Payer: Self-pay | Admitting: *Deleted

## 2024-05-30 ENCOUNTER — Emergency Department (HOSPITAL_COMMUNITY)
Admission: EM | Admit: 2024-05-30 | Discharge: 2024-05-31 | Disposition: A | Source: Home / Self Care | Attending: Emergency Medicine | Admitting: Emergency Medicine

## 2024-05-30 ENCOUNTER — Other Ambulatory Visit: Payer: Self-pay

## 2024-05-30 DIAGNOSIS — T148XXA Other injury of unspecified body region, initial encounter: Secondary | ICD-10-CM

## 2024-05-30 DIAGNOSIS — M542 Cervicalgia: Secondary | ICD-10-CM | POA: Diagnosis not present

## 2024-05-30 DIAGNOSIS — M25511 Pain in right shoulder: Secondary | ICD-10-CM | POA: Diagnosis present

## 2024-05-30 NOTE — ED Triage Notes (Signed)
 The pt has  had a swollen area to his rt neck and when he is unable to move his shoulder because it causes pain  no know injury

## 2024-05-31 MED ORDER — CYCLOBENZAPRINE HCL 10 MG PO TABS: 10.0000 mg | ORAL_TABLET | Freq: Two times a day (BID) | ORAL | 0 refills | Status: AC | PRN

## 2024-05-31 MED ORDER — LIDOCAINE 5 % EX PTCH
2.0000 | MEDICATED_PATCH | Freq: Once | CUTANEOUS | Status: DC
  Administered 2024-05-31: 2 via TRANSDERMAL
  Filled 2024-05-31: qty 2

## 2024-05-31 MED ORDER — CYCLOBENZAPRINE HCL 10 MG PO TABS
10.0000 mg | ORAL_TABLET | Freq: Once | ORAL | Status: DC
  Filled 2024-05-31: qty 1

## 2024-05-31 MED ORDER — KETOROLAC TROMETHAMINE 30 MG/ML IJ SOLN
30.0000 mg | Freq: Once | INTRAMUSCULAR | Status: AC
  Administered 2024-05-31: 30 mg via INTRAMUSCULAR
  Filled 2024-05-31: qty 1

## 2024-05-31 MED ORDER — ACETAMINOPHEN 325 MG PO TABS
650.0000 mg | ORAL_TABLET | Freq: Once | ORAL | Status: AC
  Administered 2024-05-31: 650 mg via ORAL
  Filled 2024-05-31: qty 2

## 2024-05-31 MED ORDER — HYDROCODONE-ACETAMINOPHEN 5-325 MG PO TABS
1.0000 | ORAL_TABLET | Freq: Once | ORAL | Status: AC
  Administered 2024-05-31: 1 via ORAL
  Filled 2024-05-31: qty 1

## 2024-05-31 NOTE — Discharge Instructions (Signed)
 Shane Mitchell.  Thank you for allowing us  to take care of you today.  You came to the Emergency Department today because you are having pain in your right shoulder and you are tender along the trapezius, muscles along your right neck, as well as your sternocleidomastoid which is a muscle on the side of your neck.  Here in the emergency department we gave you multimodal pain therapy and you are feeling much better.  You do not have any pain along your bones or trauma, therefore we did not feel that an x-ray would give us  any additional information that your exam could not.  You are feeling better after medications.  You can use Tylenol  and ibuprofen every 4-6 hours as needed.  You can use over-the-counter lidocaine  patches, you can use 2 at a time for up to 12 hours.  You should take them off after 12 hours, and then you can put a new patch on 12 hours later.  We are also prescribing a muscle relaxer called Flexeril , you can use this twice a day as needed for muscle pain.  Please do not drive after taking this medication as it can make you sleepy.  To-Do: 1. Please follow-up with your primary doctor within 1 - 2 weeks / as soon as possible.   Please return to the Emergency Department or call 911 if you experience have worsening of your symptoms, or do not get better, chest pain, shortness of breath, severe or significantly worsening pain, high fever, severe confusion, pass out or have any reason to think that you need emergency medical care.   We hope you feel better soon.   Mitzie Later, MD Department of Emergency Medicine [facility] [place]

## 2024-05-31 NOTE — ED Provider Notes (Signed)
 " [place] [facility] Provider Note   CSN: [id number] Arrival date & time: 05/30/24  2220     History Chief Complaint  Patient presents with   Shoulder Pain    HPI: Shane Balkcom. is a 54 y.o. male with no pertinent history who presents complaining of right shoulder pain. Patient arrived via POV.  History provided by patient.  No interpreter required during this encounter.  Patient reports that he has had several days of pain in his right shoulder extending into his right neck.  Reports that he has pain on palpation of this area when he presses on it himself, denies any neck swelling, more so complains of lateral right sided neck pain in the paraspinal musculature in the sternocleidomastoid area.  Denies any recent trauma or falls, reports that pain is worsened with range of motion, denies fever, chills, chest pain, shortness of breath, nausea, vomiting, diarrhea.  Patient's recorded medical, surgical, social, medication list and allergies were reviewed in the Snapshot window as part of the initial history.   Prior to Admission medications  Medication Sig Start Date End Date Taking? Authorizing Provider  cyclobenzaprine  (FLEXERIL ) 10 MG tablet Take 1 tablet (10 mg total) by mouth 2 (two) times daily as needed for muscle spasms. 05/31/24  Yes Rogelia Jerilynn RAMAN, MD  benzonatate  (TESSALON ) 100 MG capsule Take 1 capsule (100 mg total) by mouth 3 (three) times daily as needed. Patient not taking: Reported on 10/17/2023 02/14/22   Vivienne Delon HERO, PA-C  brompheniramine-pseudoephedrine-DM 30-2-10 MG/5ML syrup Take 5 mLs by mouth 4 (four) times daily as needed. Patient not taking: Reported on 10/17/2023 02/14/22   Vivienne Delon HERO, PA-C  gentamicin  cream (GARAMYCIN ) 0.1 % Apply 1 application topically 2 (two) times daily. Patient not taking: Reported on 10/17/2023 03/13/21   Janit Thresa HERO, DPM  ipratropium (ATROVENT ) 0.03 % nasal spray Place 2 sprays  into both nostrils every 12 (twelve) hours. Patient not taking: Reported on 10/17/2023 02/14/22   Vivienne Delon HERO, PA-C  naproxen  (NAPROSYN ) 500 MG tablet Take 1 tablet (500 mg total) by mouth 2 (two) times daily with a meal. 10/17/23   Iola Lukes, FNP  terbinafine  (LAMISIL ) 250 MG tablet Take 1 tablet (250 mg total) by mouth daily. Patient not taking: Reported on 10/17/2023 03/13/21   Janit Thresa HERO, DPM  triamcinolone  cream (KENALOG ) 0.1 % Apply 1 application. topically 2 (two) times daily. 09/29/21   Burnette, Jennifer M, PA-C     Allergies: Patient has no known allergies.   Review of Systems   ROS as per HPI  Physical Exam Updated Vital Signs BP (!) 141/102   Pulse 72   Temp 98.6 F (37 C) (Oral)   Resp 17   Ht 5' 8 (1.727 m)   Wt 117.9 kg   SpO2 98%   BMI 39.53 kg/m  Physical Exam Vitals and nursing note reviewed.  Constitutional:      General: He is not in acute distress.    Appearance: He is well-developed.  HENT:     Head: Normocephalic and atraumatic.  Eyes:     Conjunctiva/sclera: Conjunctivae normal.  Cardiovascular:     Rate and Rhythm: Normal rate and regular rhythm.     Heart sounds: No murmur heard. Pulmonary:     Effort: Pulmonary effort is normal. No respiratory distress.     Breath sounds: Normal breath sounds.  Abdominal:     Palpations: Abdomen is soft.  Tenderness: There is no abdominal tenderness.  Musculoskeletal:        General: No swelling.     Cervical back: Neck supple.     Comments: Pain with range of motion, however full range of motion of right shoulder, no bony tenderness to palpation, 5/5 strength in triceps, biceps, deltoids, supraspinatus, rotator cuff Tenderness to palpation of the right trapezius, cervical paraspinal musculature, right sternocleidomastoid  Skin:    General: Skin is warm and dry.     Capillary Refill: Capillary refill takes less than 2 seconds.  Neurological:     Mental Status: He is alert.   Psychiatric:        Mood and Affect: Mood normal.     ED Course/ Medical Decision Making/ A&P    Procedures Procedures   Medications Ordered in ED Medications  cyclobenzaprine  (FLEXERIL ) tablet 10 mg (10 mg Oral Not Given 05/31/24 0824)  lidocaine  (LIDODERM ) 5 % 2 patch (2 patches Transdermal Patch Applied 05/31/24 0825)  HYDROcodone -acetaminophen  (NORCO/VICODIN) 5-325 MG per tablet 1 tablet (1 tablet Oral Given 05/31/24 0502)  acetaminophen  (TYLENOL ) tablet 650 mg (650 mg Oral Given 05/31/24 0824)  ketorolac  (TORADOL ) 30 MG/ML injection 30 mg (30 mg Intramuscular Given 05/31/24 0824)    Medical Decision Making:   Shane LITTIE Renaye Mickey. is a 54 y.o. male who presents for right shoulder pain as per above.  Physical exam is pertinent for tenderness to palpation of the right trapezius, cervical paraspinal musculature and right sternocleidomastoid.   The differential includes but is not limited to fracture, dislocation, sprain, strain.  Independent historian: None  External data reviewed: No pertinent external data  Labs: Not indicated  Radiology: Not indicated No results found.  EKG/Medicine tests: Not indicated EKG Interpretation:                  Interventions: Norco, Tylenol , Toradol , lidocaine  patch  See the EMR for full details regarding lab and imaging results.  Patient presents to the emergency department for right shoulder pain, has muscular tenderness to palpation and pain with active and passive range of motion consistent with muscular pain, patient without bony tenderness, deformity, no recent trauma or falls, therefore feel that x-ray of the area would be of low yield.  Patient received Norco prior to my evaluation with minimal improvement, will administer multimodal pain therapy with Tylenol , Toradol , Flexeril , lidocaine  patch.  Patient does not have any chest pain, denies radiation of pain into his chest, pain reproducible consistent with musculoskeletal pain, therefore do  not feel the patient requires cardiac workup at this time.  Patient ultimately declined Flexeril  as he was worried that this would make him overly somnolent and he needs to drive home, which is reasonable, therefore will prescribe this medication outpatient.  On reevaluation after interventions patient reports significant improvement in symptoms.  Recommended stretching, continuation of multimodal pain therapy and follow-up with PCP, patient expressed understanding, discharged in stable condition.  Presentation is most consistent with acute uncomplicated illness  Discussion of management or test interpretations with external provider(s): Not indicated  Risk Drugs:OTC drugs and Prescription drug management  Disposition: DISCHARGE: I believe that the patient is safe for discharge home with outpatient follow-up. Patient was informed of all pertinent physical exam, laboratory, and imaging findings. Patient's suspected etiology of their symptom presentation was discussed with the patient and all questions were answered. We discussed following up with PCP. I provided thorough ED return precautions. The patient feels safe and comfortable with this plan.  MDM generated  using voice dictation software and may contain dictation errors.  Please contact me for any clarification or with any questions.  Clinical Impression:  1. Acute pain of right shoulder   2. Muscle strain      Discharge   Final Clinical Impression(s) / ED Diagnoses Final diagnoses:  Acute pain of right shoulder  Muscle strain    Rx / DC Orders ED Discharge Orders          Ordered    cyclobenzaprine  (FLEXERIL ) 10 MG tablet  2 times daily PRN        05/31/24 0908             Rogelia Jerilynn RAMAN, MD 05/31/24 [phone]  "
